# Patient Record
Sex: Male | Born: 1960 | Race: White | Hispanic: No | Marital: Married | Smoking: Never smoker
Health system: Southern US, Community
[De-identification: ages and names within clinical notes are randomized; demographics above are authoritative.]

## PROBLEM LIST (undated history)

## (undated) DIAGNOSIS — K5190 Ulcerative colitis, unspecified, without complications: Secondary | ICD-10-CM

## (undated) DIAGNOSIS — I10 Essential (primary) hypertension: Secondary | ICD-10-CM

## (undated) DIAGNOSIS — E785 Hyperlipidemia, unspecified: Secondary | ICD-10-CM

## (undated) HISTORY — PX: CATARACT EXTRACTION: SUR2

## (undated) HISTORY — DX: Essential (primary) hypertension: I10

## (undated) HISTORY — DX: Ulcerative colitis, unspecified, without complications: K51.90

## (undated) HISTORY — DX: Hyperlipidemia, unspecified: E78.5

## (undated) HISTORY — PX: TONSILLECTOMY: SUR1361

---

## 2009-05-23 ENCOUNTER — Encounter: Admission: RE | Admit: 2009-05-23 | Discharge: 2009-05-23 | Payer: Self-pay | Admitting: Unknown Physician Specialty

## 2010-03-10 IMAGING — CR DG ELBOW COMPLETE 3+V*R*
4 series · 4 of 4 positions shown · non-contrast
Comparison: None.

CLINICAL DATA: Right elbow injury 05/11/2009.  Persistent pain and
erythema.

RIGHT ELBOW - COMPLETE 3+ VIEW   05/23/2009:

[view not recorded (1 of 4)]
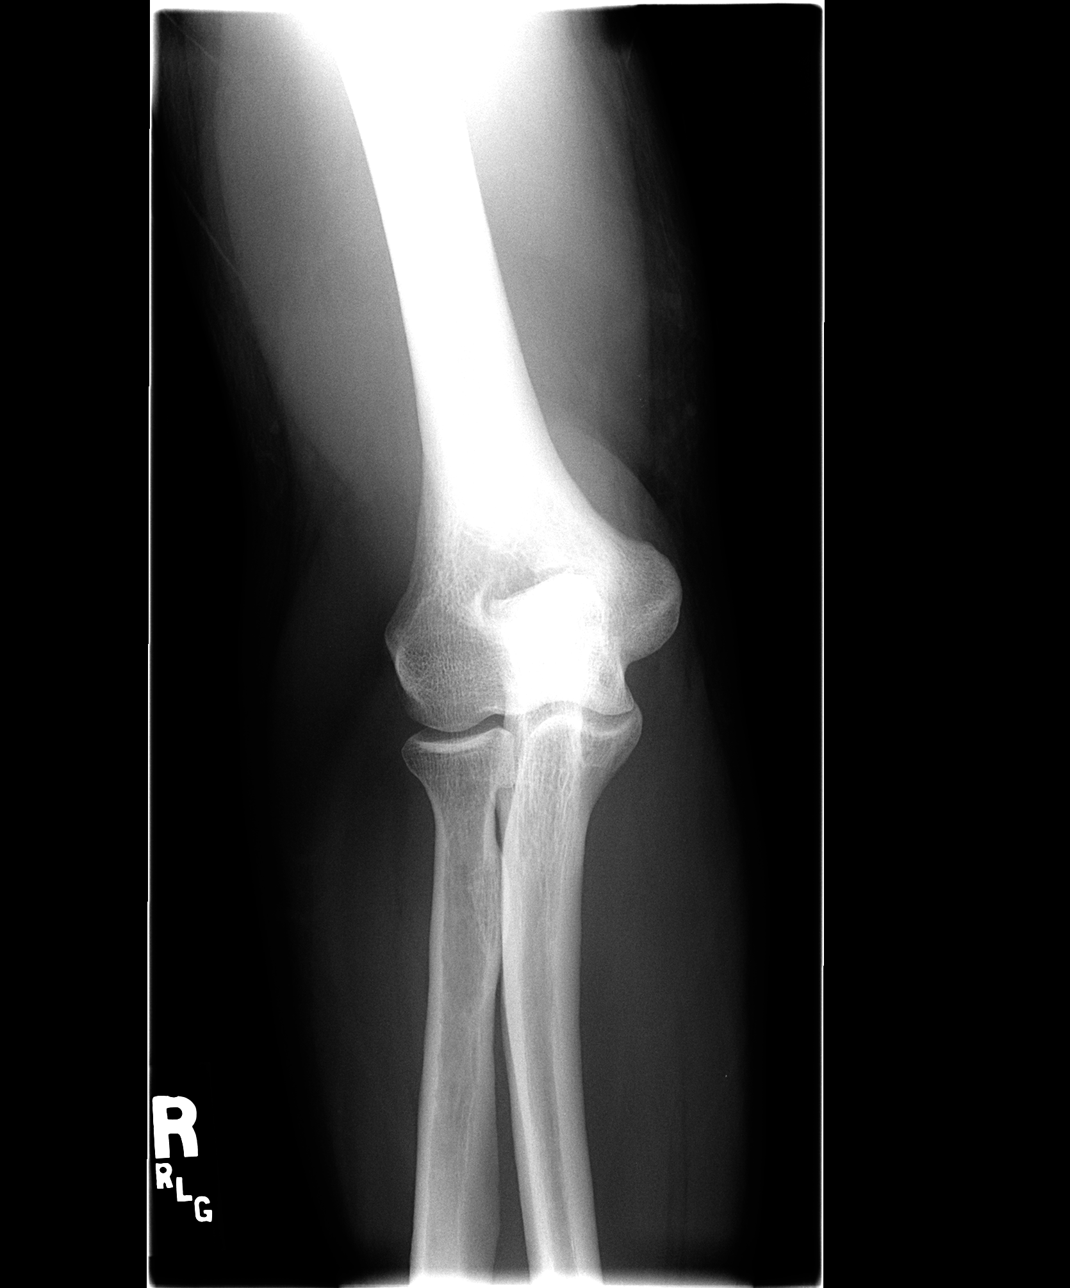

[view not recorded (2 of 4)]
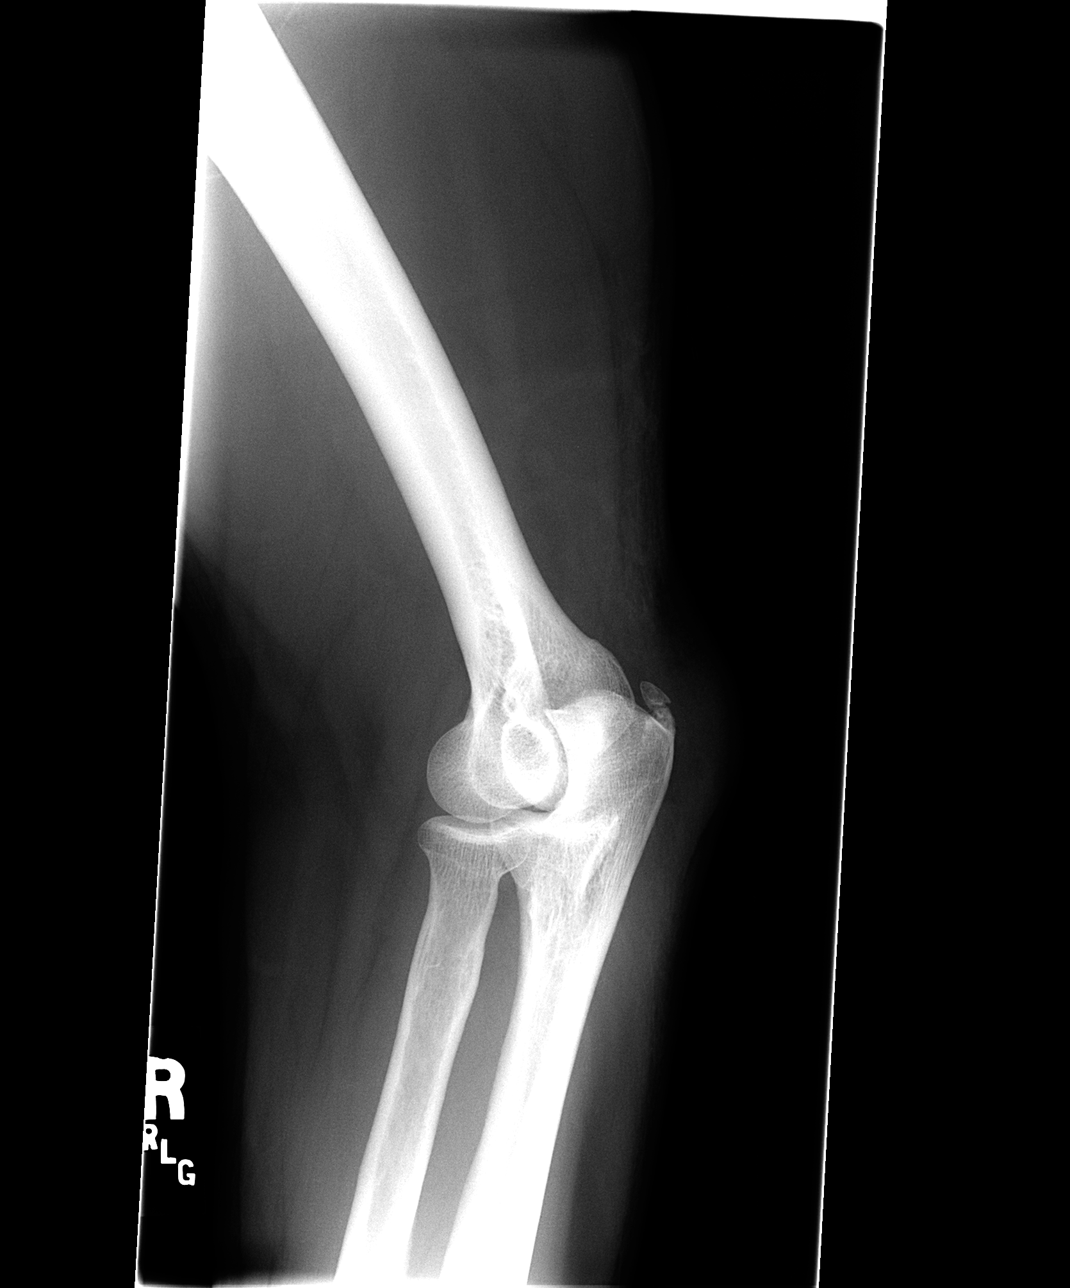

[view not recorded (3 of 4)]
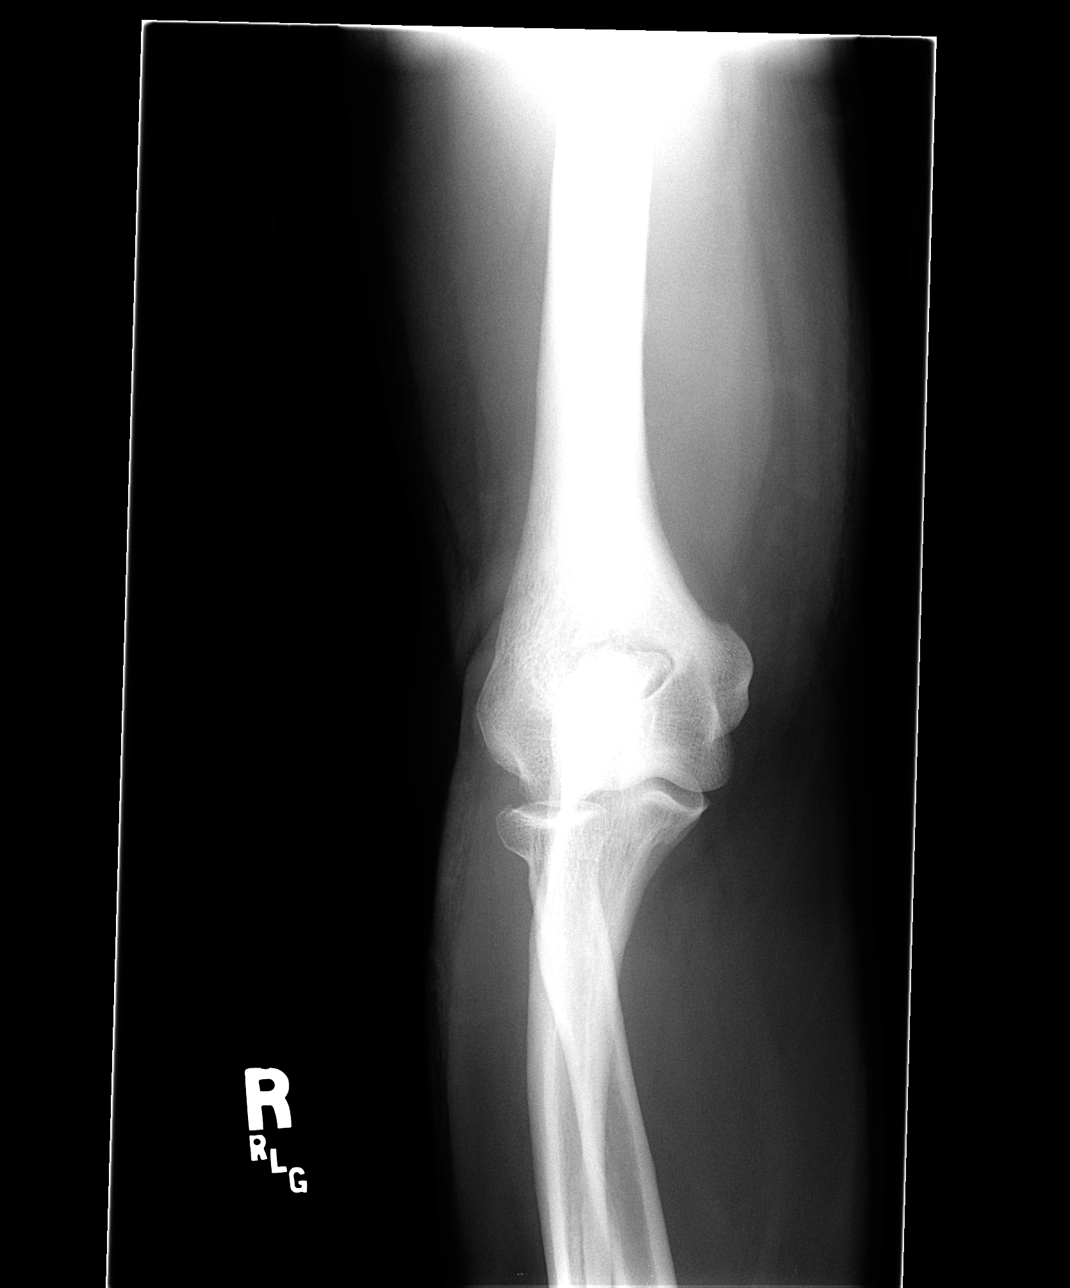

[view not recorded (4 of 4)]
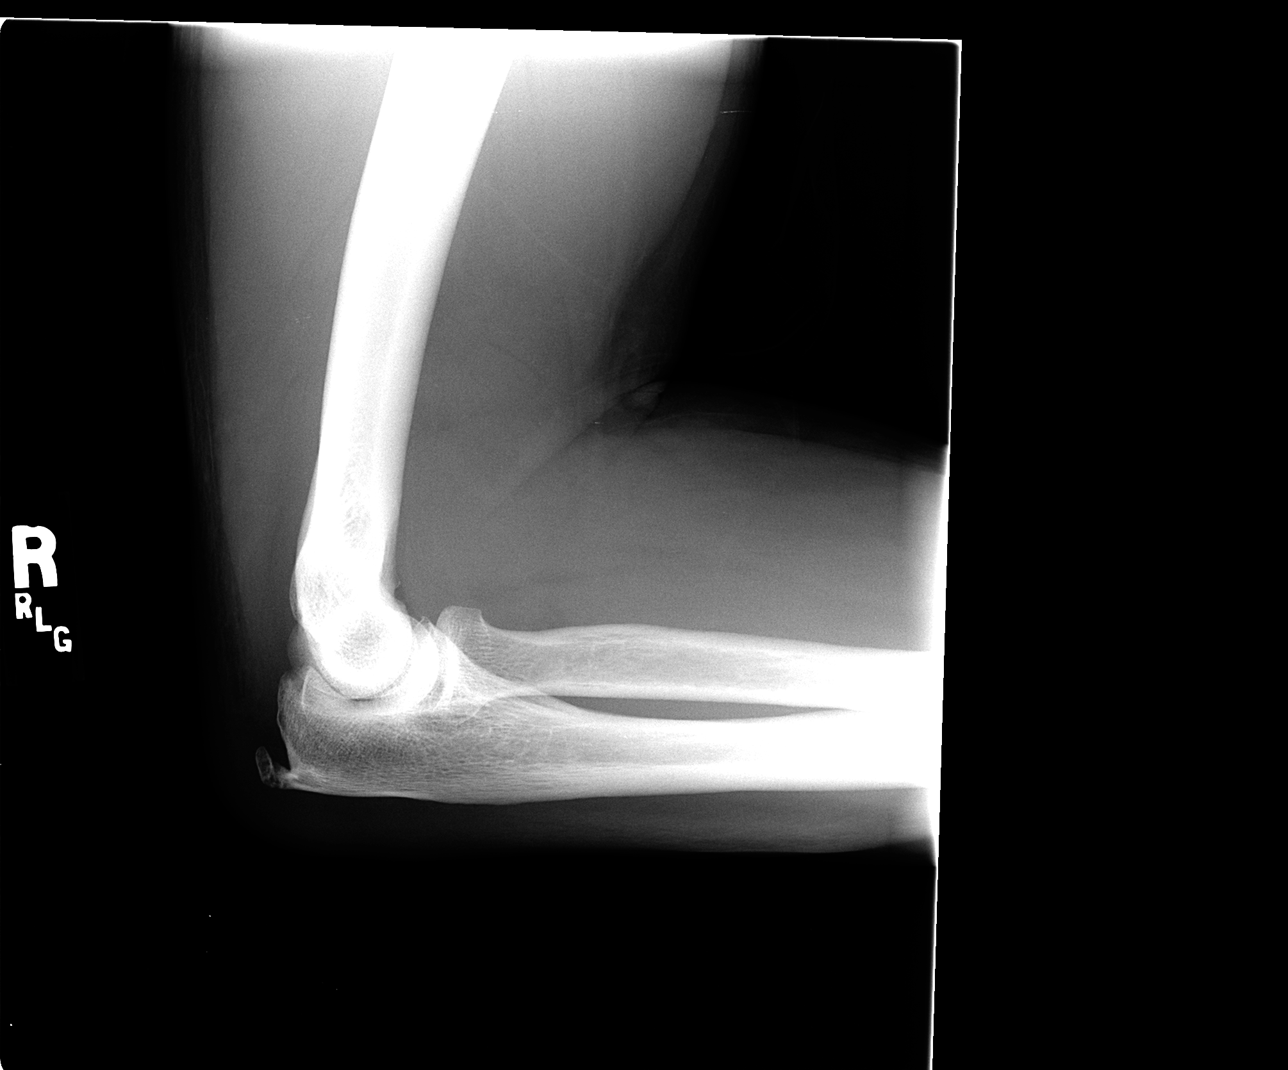

[4 of 4 positions shown; findings below may reference images not displayed]

FINDINGS: Calcification at the insertion of the triceps tendon on
the olecranon.  No evidence of acute fracture or dislocation.
Joint spaces well preserved.  Soft tissue swelling overlying the
olecranon.  No posterior fat pad sign.  No opaque foreign bodies in
the soft tissues.
IMPRESSION: No acute skeletal abnormalities.  Calcification at the insertion of
the triceps tendon on the olecranon.  Soft tissue swelling
overlying the olecranon, query olecranon bursitis.

## 2016-03-05 DIAGNOSIS — R030 Elevated blood-pressure reading, without diagnosis of hypertension: Secondary | ICD-10-CM | POA: Diagnosis not present

## 2016-03-05 DIAGNOSIS — Z0000 Encounter for general adult medical examination without abnormal findings: Secondary | ICD-10-CM | POA: Diagnosis not present

## 2016-03-05 DIAGNOSIS — E785 Hyperlipidemia, unspecified: Secondary | ICD-10-CM | POA: Diagnosis not present

## 2016-07-23 DIAGNOSIS — K5100 Ulcerative (chronic) pancolitis without complications: Secondary | ICD-10-CM | POA: Diagnosis not present

## 2017-03-11 DIAGNOSIS — R030 Elevated blood-pressure reading, without diagnosis of hypertension: Secondary | ICD-10-CM | POA: Diagnosis not present

## 2017-03-11 DIAGNOSIS — Z0000 Encounter for general adult medical examination without abnormal findings: Secondary | ICD-10-CM | POA: Diagnosis not present

## 2017-03-11 DIAGNOSIS — L989 Disorder of the skin and subcutaneous tissue, unspecified: Secondary | ICD-10-CM | POA: Diagnosis not present

## 2017-03-11 DIAGNOSIS — E785 Hyperlipidemia, unspecified: Secondary | ICD-10-CM | POA: Diagnosis not present

## 2017-03-18 DIAGNOSIS — L989 Disorder of the skin and subcutaneous tissue, unspecified: Secondary | ICD-10-CM | POA: Diagnosis not present

## 2017-09-09 DIAGNOSIS — K5100 Ulcerative (chronic) pancolitis without complications: Secondary | ICD-10-CM | POA: Diagnosis not present

## 2017-09-09 DIAGNOSIS — D123 Benign neoplasm of transverse colon: Secondary | ICD-10-CM | POA: Diagnosis not present

## 2017-09-13 DIAGNOSIS — I10 Essential (primary) hypertension: Secondary | ICD-10-CM | POA: Diagnosis not present

## 2017-10-14 DIAGNOSIS — I10 Essential (primary) hypertension: Secondary | ICD-10-CM | POA: Diagnosis not present

## 2018-04-07 DIAGNOSIS — K5100 Ulcerative (chronic) pancolitis without complications: Secondary | ICD-10-CM | POA: Diagnosis not present

## 2018-04-17 DIAGNOSIS — R030 Elevated blood-pressure reading, without diagnosis of hypertension: Secondary | ICD-10-CM | POA: Diagnosis not present

## 2018-04-17 DIAGNOSIS — M79606 Pain in leg, unspecified: Secondary | ICD-10-CM | POA: Diagnosis not present

## 2018-04-17 DIAGNOSIS — I10 Essential (primary) hypertension: Secondary | ICD-10-CM | POA: Diagnosis not present

## 2019-03-29 DIAGNOSIS — Z0000 Encounter for general adult medical examination without abnormal findings: Secondary | ICD-10-CM | POA: Diagnosis not present

## 2019-03-29 DIAGNOSIS — Z23 Encounter for immunization: Secondary | ICD-10-CM | POA: Diagnosis not present

## 2019-03-29 DIAGNOSIS — I10 Essential (primary) hypertension: Secondary | ICD-10-CM | POA: Diagnosis not present

## 2019-03-29 DIAGNOSIS — E785 Hyperlipidemia, unspecified: Secondary | ICD-10-CM | POA: Diagnosis not present

## 2019-04-05 DIAGNOSIS — Z23 Encounter for immunization: Secondary | ICD-10-CM | POA: Diagnosis not present

## 2019-05-04 DIAGNOSIS — K5100 Ulcerative (chronic) pancolitis without complications: Secondary | ICD-10-CM | POA: Diagnosis not present

## 2021-09-10 NOTE — Progress Notes (Signed)
Referring-Tyler Kingsley Callander MD Reason for referral-palpitations  HPI: 61 year old male for evaluation of palpitations at request of Ileene Patrick MD. Laboratories January 2023 showed hemoglobin 15.7, creatinine 0.96, sodium 139, potassium 3.5, LDL 87, total cholesterol 132.  Patient typically does not have dyspnea on exertion, orthopnea, PND, pedal edema, exertional chest pain or syncope.  He does have occasional palpitations described as feeling his heartbeat "hard".  Mild tightness associated with these feelings.  They can occur either with exertion or at rest.  He has not had syncope.  Because of the above cardiology now asked to evaluate.  Current Outpatient Medications  Medication Sig Dispense Refill   amLODipine (NORVASC) 5 MG tablet Take 5 mg by mouth daily.     chlorthalidone (HYGROTON) 25 MG tablet Take 25 mg by mouth daily.     escitalopram (LEXAPRO) 10 MG tablet Take 10 mg by mouth daily.     losartan (COZAAR) 100 MG tablet Take 100 mg by mouth daily.     rosuvastatin (CRESTOR) 5 MG tablet Take 5 mg by mouth daily.     No current facility-administered medications for this visit.    Not on File   Past Medical History:  Diagnosis Date   Hyperlipidemia    Hypertension    Ulcerative colitis ([place])     Past Surgical History:  Procedure Laterality Date   CATARACT EXTRACTION Right    TONSILLECTOMY      Social History   Socioeconomic History   Marital status: Married    Spouse name: Not on file   Number of children: Not on file   Years of education: Not on file   Highest education level: Not on file  Occupational History   Not on file  Tobacco Use   Smoking status: Never   Smokeless tobacco: Never  Substance and Sexual Activity   Alcohol use: Yes    Comment: 2-3 drinks per day   Drug use: Never   Sexual activity: Not on file  Other Topics Concern   Not on file  Social History Narrative   Not on file   Social Determinants of Health   Financial  Resource Strain: Not on file  Food Insecurity: Not on file  Transportation Needs: Not on file  Physical Activity: Not on file  Stress: Not on file  Social Connections: Not on file  Intimate Partner Violence: Not on file    Family History  Problem Relation Age of Onset   Cancer Father     ROS: no fevers or chills, productive cough, hemoptysis, dysphasia, odynophagia, melena, hematochezia, dysuria, hematuria, rash, seizure activity, orthopnea, PND, pedal edema, claudication. Remaining systems are negative.  Physical Exam:   Blood pressure (!) 146/66, pulse 92, height 5\' 11"  (1.803 m), weight 247 lb 12.8 oz (112.4 kg), SpO2 95 %.  General:  Well developed/well nourished in NAD Skin warm/dry Patient not depressed No peripheral clubbing Back-normal HEENT-normal/normal eyelids Neck supple/normal carotid upstroke bilaterally; no bruits; no JVD; no thyromegaly chest - CTA/ normal expansion CV - RRR/normal S1 and S2; no murmurs, rubs or gallops;  PMI nondisplaced Abdomen -NT/ND, no HSM, no mass, + bowel sounds, no bruit 2+ femoral pulses, no bruits Ext-no edema, chords, 2+ DP Neuro-grossly nonfocal  ECG -normal sinus rhythm with PACs, no ST changes.  Personally reviewed  A/P  1 palpitations-etiology unclear.  He was having minimal palpitations in the office and electrocardiogram suggested PACs.  I will arrange an echocardiogram to assess LV function.  We will  also schedule a 3-day Zio patch to further assess.  We will consider addition of beta-blocker in the future if necessary.  2 hypertension-blood pressure is mildly elevated.  However recently has been better controlled.  He will follow and we will add medications as needed.  3 hyperlipidemia-continue statin.  Kirk Ruths, MD

## 2021-09-23 ENCOUNTER — Ambulatory Visit (INDEPENDENT_AMBULATORY_CARE_PROVIDER_SITE_OTHER): Payer: BC Managed Care – PPO

## 2021-09-23 ENCOUNTER — Ambulatory Visit (INDEPENDENT_AMBULATORY_CARE_PROVIDER_SITE_OTHER): Payer: BC Managed Care – PPO | Admitting: Cardiology

## 2021-09-23 ENCOUNTER — Other Ambulatory Visit: Payer: Self-pay

## 2021-09-23 ENCOUNTER — Encounter: Payer: Self-pay | Admitting: Cardiology

## 2021-09-23 VITALS — BP 146/66 | HR 92 | Ht 71.0 in | Wt 247.8 lb

## 2021-09-23 DIAGNOSIS — R002 Palpitations: Secondary | ICD-10-CM

## 2021-09-23 DIAGNOSIS — E7800 Pure hypercholesterolemia, unspecified: Secondary | ICD-10-CM | POA: Diagnosis not present

## 2021-09-23 DIAGNOSIS — I10 Essential (primary) hypertension: Secondary | ICD-10-CM | POA: Diagnosis not present

## 2021-09-23 NOTE — Progress Notes (Unsigned)
Enrolled patient for a 3 day Zio XT monitor to be mailed to patients home  

## 2021-09-23 NOTE — Patient Instructions (Signed)
Testing/Procedures:  Your physician has requested that you have an echocardiogram. Echocardiography is a painless test that uses sound waves to create images of your heart. It provides your doctor with information about the size and shape of your heart and how well your hearts chambers and valves are working. This procedure takes approximately one hour. There are no restrictions for this procedure. [address]   ZIO XT- Long Term Monitor Instructions  Your physician has requested you wear a ZIO patch monitor for 3 days.  This is a single patch monitor. [employer] supplies one patch monitor per enrollment. Additional stickers are not available. Please do not apply patch if you will be having a Nuclear Stress Test,  Echocardiogram, Cardiac CT, MRI, or Chest Xray during the period you would be wearing the  monitor. The patch cannot be worn during these tests. You cannot remove and re-apply the  ZIO XT patch monitor.  Your ZIO patch monitor will be mailed 3 day USPS to your address on file. It may take 3-5 days  to receive your monitor after you have been enrolled.  Once you have received your monitor, please review the enclosed instructions. Your monitor  has already been registered assigning a specific monitor serial # to you.  Billing and Patient Assistance Program Information  We have supplied [employer] with any of your insurance information on file for billing purposes. [employer] offers a sliding scale Patient Assistance Program for patients that do not have  insurance, or whose insurance does not completely cover the cost of the ZIO monitor.  You must apply for the Patient Assistance Program to qualify for this discounted rate.  To apply, please call [employer] at [phone], select option 4, select option 2, ask to apply for  Patient Assistance Program. Meredeth Ide will ask your household income, and how many people  are in your household. They will quote your  out-of-pocket cost based on that information.  [employer] will also be able to set up a 13-month, interest-free payment plan if needed.  Applying the monitor   Shave hair from upper left chest.  Hold abrader disc by orange tab. Rub abrader in 40 strokes over the upper left chest as  indicated in your monitor instructions.  Clean area with 4 enclosed alcohol pads. Let dry.  Apply patch as indicated in monitor instructions. Patch will be placed under collarbone on left  side of chest with arrow pointing upward.  Rub patch adhesive wings for 2 minutes. Remove white label marked "1". Remove the white  label marked "2". Rub patch adhesive wings for 2 additional minutes.  While looking in a mirror, press and release button in center of patch. A small green light will  flash 3-4 times. This will be your only indicator that the monitor has been turned on.  Do not shower for the first 24 hours. You may shower after the first 24 hours.  Press the button if you feel a symptom. You will hear a small click. Record Date, Time and  Symptom in the Patient Logbook.  When you are ready to remove the patch, follow instructions on the last 2 pages of Patient  Logbook. Stick patch monitor onto the last page of Patient Logbook.  Place Patient Logbook in the blue and white box. Use locking tab on box and tape box closed  securely. The blue and white box has prepaid postage on it. Please place it in the mailbox as  soon as possible. Your physician should have  your test results approximately 7 days after the  monitor has been mailed back to [facility].  Call [facility] Customer Care at [phone] if you have questions regarding  your ZIO XT patch monitor. Call them immediately if you see an orange light blinking on your  monitor.  If your monitor falls off in less than 4 days, contact our Monitor department at [phone].  If your monitor becomes loose or falls off after 4 days call [employer] at  [phone] for  suggestions on securing your monitor    Follow-Up: At [facility], you and your health needs are our priority.  As part of our continuing mission to provide you with exceptional heart care, we have created designated Provider Care Teams.  These Care Teams include your primary Cardiologist (physician) and Advanced Practice Providers (APPs -  Physician Assistants and Nurse Practitioners) who all work together to provide you with the care you need, when you need it.  We recommend signing up for the patient portal called "MyChart".  Sign up information is provided on this After Visit Summary.  MyChart is used to connect with patients for Virtual Visits (Telemedicine).  Patients are able to view lab/test results, encounter notes, upcoming appointments, etc.  Non-urgent messages can be sent to your provider as well.   To learn more about what you can do with MyChart, go to [email or web address].    Your next appointment:   3 month(s)  The format for your next appointment:   In Person  Provider:   Olga Millers, MD

## 2021-09-28 DIAGNOSIS — R002 Palpitations: Secondary | ICD-10-CM | POA: Diagnosis not present

## 2021-10-01 ENCOUNTER — Ambulatory Visit (HOSPITAL_COMMUNITY): Payer: BC Managed Care – PPO | Attending: Cardiology

## 2021-10-01 ENCOUNTER — Other Ambulatory Visit: Payer: Self-pay

## 2021-10-01 DIAGNOSIS — I5030 Unspecified diastolic (congestive) heart failure: Secondary | ICD-10-CM

## 2021-10-01 DIAGNOSIS — R002 Palpitations: Secondary | ICD-10-CM | POA: Diagnosis present

## 2021-10-01 LAB — ECHOCARDIOGRAM COMPLETE
Area-P 1/2: 2.88 cm2
S' Lateral: 3.3 cm

## 2021-10-02 ENCOUNTER — Telehealth: Payer: Self-pay | Admitting: Cardiology

## 2021-10-02 NOTE — Telephone Encounter (Signed)
Patient was returning call. Please advise ?

## 2021-10-02 NOTE — Telephone Encounter (Signed)
Spoke with patient regarding the following results. Patient made aware and patient verbalized understanding.  ? ?[name], MD    ?  ?Normal LV function ?Olga Millers  ? ?

## 2021-10-15 ENCOUNTER — Telehealth: Payer: Self-pay | Admitting: *Deleted

## 2021-10-15 MED ORDER — METOPROLOL SUCCINATE ER 25 MG PO TB24: 25.0000 mg | ORAL_TABLET | Freq: Every day | ORAL | 3 refills | Status: DC

## 2021-10-15 MED ORDER — ASPIRIN EC 81 MG PO TBEC: 81.0000 mg | DELAYED_RELEASE_TABLET | Freq: Every day | ORAL | 3 refills | Status: DC

## 2021-10-15 NOTE — Telephone Encounter (Signed)
-----   Message from [name], MD sent at 10/12/2021 11:56 AM EDT ----- ?Short episode of paroxysmal atrial fibrillation.  Add Toprol 25 mg daily.  Add aspirin 81 mg daily.  Schedule follow-up office visit. ?Tyler Serrano ? ?

## 2021-10-15 NOTE — Telephone Encounter (Signed)
pt aware of results  ?New script sent to the pharmacy  ?Follow up scheduled  ?

## 2021-11-27 NOTE — Progress Notes (Deleted)
      HPI: Follow-up palpitations.  Echocardiogram March 2023 showed normal LV function, grade 1 diastolic dysfunction, mildly dilated aortic root at 41 mm.  Monitor March 2023 showed sinus rhythm with PACs, short burst of atrial fibrillation with rapid ventricular response, occasional PVCs and 5 beats nonsustained ventricular tachycardia.  Since last seen  Current Outpatient Medications  Medication Sig Dispense Refill   amLODipine (NORVASC) 5 MG tablet Take 5 mg by mouth daily.     aspirin EC 81 MG tablet Take 1 tablet (81 mg total) by mouth daily. Swallow whole. 90 tablet 3   chlorthalidone (HYGROTON) 25 MG tablet Take 25 mg by mouth daily.     escitalopram (LEXAPRO) 10 MG tablet Take 10 mg by mouth daily.     losartan (COZAAR) 100 MG tablet Take 100 mg by mouth daily.     metoprolol succinate (TOPROL XL) 25 MG 24 hr tablet Take 1 tablet (25 mg total) by mouth at bedtime. 90 tablet 3   rosuvastatin (CRESTOR) 5 MG tablet Take 5 mg by mouth daily.     No current facility-administered medications for this visit.     Past Medical History:  Diagnosis Date   Hyperlipidemia    Hypertension    Ulcerative colitis (H[facility])     Past Surgical History:  Procedure Laterality Date   CATARACT EXTRACTION Right    TONSILLECTOMY      Social History   Socioeconomic History   Marital status: Married    Spouse name: Not on file   Number of children: Not on file   Years of education: Not on file   Highest education level: Not on file  Occupational History   Not on file  Tobacco Use   Smoking status: Never   Smokeless tobacco: Never  Substance and Sexual Activity   Alcohol use: Yes    Comment: 2-3 drinks per day   Drug use: Never   Sexual activity: Not on file  Other Topics Concern   Not on file  Social History Narrative   Not on file   Social Determinants of Health   Financial Resource Strain: Not on file  Food Insecurity: Not on file  Transportation Needs: Not on file  Physical  Activity: Not on file  Stress: Not on file  Social Connections: Not on file  Intimate Partner Violence: Not on file    Family History  Problem Relation Age of Onset   Cancer Father     ROS: no fevers or chills, productive cough, hemoptysis, dysphasia, odynophagia, melena, hematochezia, dysuria, hematuria, rash, seizure activity, orthopnea, PND, pedal edema, claudication. Remaining systems are negative.  Physical Exam: Well-developed well-nourished in no acute distress.  Skin is warm and dry.  HEENT is normal.  Neck is supple.  Chest is clear to auscultation with normal expansion.  Cardiovascular exam is regular rate and rhythm.  Abdominal exam nontender or distended. No masses palpated. Extremities show no edema. neuro grossly intact  ECG- personally reviewed  A/P  1 paroxysmal atrial fibrillation-patient recently noted to have short episodes of atrial fibrillation.  Toprol was added and his symptoms have improved.  CHA2DS2-VASc is 1 for hypertension.  We will continue aspirin 81 mg daily.  2 palpitations-continue beta-blocker.  3 hypertension-blood pressure controlled.  Continue present medications.  4 hyperlipidemia-continue statin.  Olga Millers, MD

## 2021-12-09 ENCOUNTER — Ambulatory Visit: Payer: BC Managed Care – PPO | Admitting: Cardiology

## 2021-12-29 NOTE — Progress Notes (Signed)
HPI: Follow-up atrial fibrillation.  Echocardiogram March 2023 showed normal LV function, grade 1 diastolic dysfunction, mildly dilated aortic root at 41 mm.  Monitor March 2023 showed sinus rhythm with PACs, short burst of atrial fibrillation with rapid ventricular response, occasional PVCs and 5 beats nonsustained ventricular tachycardia.  Since last seen he has dyspnea with more vigorous activities.  There is no chest pain or syncope.  His palpitations have improved.  Current Outpatient Medications  Medication Sig Dispense Refill   amLODipine (NORVASC) 5 MG tablet Take 5 mg by mouth daily.     chlorthalidone (HYGROTON) 25 MG tablet Take 25 mg by mouth daily.     escitalopram (LEXAPRO) 10 MG tablet Take 10 mg by mouth daily.     losartan (COZAAR) 100 MG tablet Take 100 mg by mouth daily.     mesalamine (LIALDA) 1.2 g EC tablet Take 2.4 g by mouth daily.     rosuvastatin (CRESTOR) 5 MG tablet Take 5 mg by mouth daily.     aspirin EC 81 MG tablet Take 1 tablet (81 mg total) by mouth daily. Swallow whole. (Patient not taking: Reported on 01/06/2022) 90 tablet 3   metoprolol succinate (TOPROL XL) 25 MG 24 hr tablet Take 1 tablet (25 mg total) by mouth at bedtime. (Patient not taking: Reported on 01/06/2022) 90 tablet 3   No current facility-administered medications for this visit.     Past Medical History:  Diagnosis Date   Hyperlipidemia    Hypertension    Ulcerative colitis ([facility])     Past Surgical History:  Procedure Laterality Date   CATARACT EXTRACTION Right    TONSILLECTOMY      Social History   Socioeconomic History   Marital status: Married    Spouse name: Not on file   Number of children: Not on file   Years of education: Not on file   Highest education level: Not on file  Occupational History   Not on file  Tobacco Use   Smoking status: Never   Smokeless tobacco: Never  Substance and Sexual Activity   Alcohol use: Yes    Comment: 2-3 drinks per day   Drug  use: Never   Sexual activity: Not on file  Other Topics Concern   Not on file  Social History Narrative   Not on file   Social Determinants of Health   Financial Resource Strain: Not on file  Food Insecurity: Not on file  Transportation Needs: Not on file  Physical Activity: Not on file  Stress: Not on file  Social Connections: Not on file  Intimate Partner Violence: Not on file    Family History  Problem Relation Age of Onset   Cancer Father     ROS: no fevers or chills, productive cough, hemoptysis, dysphasia, odynophagia, melena, hematochezia, dysuria, hematuria, rash, seizure activity, orthopnea, PND, pedal edema, claudication. Remaining systems are negative.  Physical Exam: Well-developed well-nourished in no acute distress.  Skin is warm and dry.  HEENT is normal.  Neck is supple.  Chest is clear to auscultation with normal expansion.  Cardiovascular exam is regular rate and rhythm.  Abdominal exam nontender or distended. No masses palpated. Extremities show no edema. neuro grossly intact  ECG-normal sinus rhythm at a rate of 74, no ST changes.  Personally reviewed  A/P  1 paroxysmal atrial fibrillation-patient recently noted to have short episodes of atrial fibrillation.  Toprol was added and his symptoms have improved.  CHA2DS2-VASc is 1 for  hypertension.  I have discussed aspirin versus anticoagulation with him.  We will discontinue aspirin and treat with apixaban 5 mg twice daily.  2 palpitations-continue beta-blocker.  3 hypertension-blood pressure controlled.  Continue present medications.  4 hyperlipidemia-continue statin.  Olga Millers, MD

## 2022-01-06 ENCOUNTER — Encounter: Payer: Self-pay | Admitting: Cardiology

## 2022-01-06 ENCOUNTER — Ambulatory Visit (INDEPENDENT_AMBULATORY_CARE_PROVIDER_SITE_OTHER): Payer: BC Managed Care – PPO | Admitting: Cardiology

## 2022-01-06 VITALS — BP 138/78 | HR 74 | Ht 71.0 in | Wt 253.1 lb

## 2022-01-06 DIAGNOSIS — E7800 Pure hypercholesterolemia, unspecified: Secondary | ICD-10-CM | POA: Diagnosis not present

## 2022-01-06 DIAGNOSIS — I480 Paroxysmal atrial fibrillation: Secondary | ICD-10-CM

## 2022-01-06 DIAGNOSIS — I10 Essential (primary) hypertension: Secondary | ICD-10-CM

## 2022-01-06 DIAGNOSIS — R002 Palpitations: Secondary | ICD-10-CM

## 2022-01-06 MED ORDER — APIXABAN 5 MG PO TABS: 5.0000 mg | ORAL_TABLET | Freq: Two times a day (BID) | ORAL | 6 refills | Status: AC

## 2022-01-06 NOTE — Patient Instructions (Signed)
Medication Instructions:   STOP ASPIRIN  START ELIQUIS 5 MG ONE TABLET TWICE DAILY  *If you need a refill on your cardiac medications before your next appointment, please call your pharmacy*   Follow-Up: At [facility], you and your health needs are our priority.  As part of our continuing mission to provide you with exceptional heart care, we have created designated Provider Care Teams.  These Care Teams include your primary Cardiologist (physician) and Advanced Practice Providers (APPs -  Physician Assistants and Nurse Practitioners) who all work together to provide you with the care you need, when you need it.  We recommend signing up for the patient portal called "MyChart".  Sign up information is provided on this After Visit Summary.  MyChart is used to connect with patients for Virtual Visits (Telemedicine).  Patients are able to view lab/test results, encounter notes, upcoming appointments, etc.  Non-urgent messages can be sent to your provider as well.   To learn more about what you can do with MyChart, go to [email or web address].    Your next appointment:   6 month(s)  The format for your next appointment:   In Person  Provider:   Olga Millers, MD    Important Information About Sugar

## 2022-01-08 NOTE — Addendum Note (Signed)
Addended by: Freddi Starr on: 01/08/2022 08:38 AM   Modules accepted: Orders

## 2022-06-23 NOTE — Progress Notes (Deleted)
     HPI: Follow-up atrial fibrillation.  Echocardiogram March 2023 showed normal LV function, grade 1 diastolic dysfunction, mildly dilated aortic root at 41 mm.  Monitor March 2023 showed sinus rhythm with PACs, short burst of atrial fibrillation with rapid ventricular response, occasional PVCs and 5 beats nonsustained ventricular tachycardia.  Since last seen  Current Outpatient Medications  Medication Sig Dispense Refill   amLODipine (NORVASC) 5 MG tablet Take 5 mg by mouth daily.     apixaban (ELIQUIS) 5 MG TABS tablet Take 1 tablet (5 mg total) by mouth 2 (two) times daily. 60 tablet 6   chlorthalidone (HYGROTON) 25 MG tablet Take 25 mg by mouth daily.     escitalopram (LEXAPRO) 10 MG tablet Take 10 mg by mouth daily.     losartan (COZAAR) 100 MG tablet Take 100 mg by mouth daily.     mesalamine (LIALDA) 1.2 g EC tablet Take 2.4 g by mouth daily.     metoprolol succinate (TOPROL XL) 25 MG 24 hr tablet Take 1 tablet (25 mg total) by mouth at bedtime. (Patient not taking: Reported on 01/06/2022) 90 tablet 3   rosuvastatin (CRESTOR) 5 MG tablet Take 5 mg by mouth daily.     No current facility-administered medications for this visit.     Past Medical History:  Diagnosis Date   Hyperlipidemia    Hypertension    Ulcerative colitis (HCC)     Past Surgical History:  Procedure Laterality Date   CATARACT EXTRACTION Right    TONSILLECTOMY      Social History   Socioeconomic History   Marital status: Married    Spouse name: Not on file   Number of children: Not on file   Years of education: Not on file   Highest education level: Not on file  Occupational History   Not on file  Tobacco Use   Smoking status: Never   Smokeless tobacco: Never  Substance and Sexual Activity   Alcohol use: Yes    Comment: 2-3 drinks per day   Drug use: Never   Sexual activity: Not on file  Other Topics Concern   Not on file  Social History Narrative   Not on file   Social Determinants of  Health   Financial Resource Strain: Not on file  Food Insecurity: Not on file  Transportation Needs: Not on file  Physical Activity: Not on file  Stress: Not on file  Social Connections: Not on file  Intimate Partner Violence: Not on file    Family History  Problem Relation Age of Onset   Cancer Father     ROS: no fevers or chills, productive cough, hemoptysis, dysphasia, odynophagia, melena, hematochezia, dysuria, hematuria, rash, seizure activity, orthopnea, PND, pedal edema, claudication. Remaining systems are negative.  Physical Exam: Well-developed well-nourished in no acute distress.  Skin is warm and dry.  HEENT is normal.  Neck is supple.  Chest is clear to auscultation with normal expansion.  Cardiovascular exam is regular rate and rhythm.  Abdominal exam nontender or distended. No masses palpated. Extremities show no edema. neuro grossly intact  ECG- personally reviewed  A/P  1 paroxysmal atrial fibrillation-patient has had no further symptoms.  Will continue Toprol for rate control if atrial fibrillation recurs.  Continue apixaban.  2 hypertension-blood pressure controlled.  Continue present medical regimen.  3 hyperlipidemia-continue statin.  4 palpitations-continue beta-blocker.  Olga Millers, MD

## 2022-07-07 ENCOUNTER — Ambulatory Visit: Payer: BC Managed Care – PPO | Admitting: Cardiology

## 2022-08-05 ENCOUNTER — Other Ambulatory Visit: Payer: Self-pay | Admitting: Cardiology

## 2022-08-05 DIAGNOSIS — I480 Paroxysmal atrial fibrillation: Secondary | ICD-10-CM

## 2023-03-16 ENCOUNTER — Other Ambulatory Visit: Payer: Self-pay | Admitting: Cardiology

## 2023-03-16 DIAGNOSIS — I480 Paroxysmal atrial fibrillation: Secondary | ICD-10-CM

## 2023-04-11 ENCOUNTER — Other Ambulatory Visit: Payer: Self-pay | Admitting: Cardiology

## 2023-04-11 DIAGNOSIS — I480 Paroxysmal atrial fibrillation: Secondary | ICD-10-CM

## 2023-04-21 ENCOUNTER — Other Ambulatory Visit: Payer: Self-pay | Admitting: Cardiology

## 2023-04-21 DIAGNOSIS — I480 Paroxysmal atrial fibrillation: Secondary | ICD-10-CM

## 2023-05-06 ENCOUNTER — Other Ambulatory Visit: Payer: Self-pay | Admitting: Cardiology

## 2023-05-06 DIAGNOSIS — I480 Paroxysmal atrial fibrillation: Secondary | ICD-10-CM

## 2023-07-13 ENCOUNTER — Other Ambulatory Visit: Payer: Self-pay | Admitting: Cardiology

## 2023-07-13 DIAGNOSIS — I480 Paroxysmal atrial fibrillation: Secondary | ICD-10-CM

## 2023-09-09 ENCOUNTER — Other Ambulatory Visit: Payer: Self-pay | Admitting: Cardiology

## 2023-09-09 DIAGNOSIS — I480 Paroxysmal atrial fibrillation: Secondary | ICD-10-CM
